# Patient Record
Sex: Male | Born: 1995 | Hispanic: Yes | Marital: Single | Smoking: Current every day smoker
Health system: Southern US, Community
[De-identification: ages and names within clinical notes are randomized; demographics above are authoritative.]

---
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
## 2018-09-20 ENCOUNTER — Other Ambulatory Visit: Payer: Self-pay

## 2018-09-20 ENCOUNTER — Encounter: Payer: Self-pay | Admitting: Emergency Medicine

## 2018-09-20 ENCOUNTER — Emergency Department
Admission: EM | Admit: 2018-09-20 | Discharge: 2018-09-20 | Disposition: A | Discharge status: Home / Self Care | Attending: Emergency Medicine | Admitting: Emergency Medicine

## 2018-09-20 ENCOUNTER — Emergency Department

## 2018-09-20 DIAGNOSIS — F17210 Nicotine dependence, cigarettes, uncomplicated: Secondary | ICD-10-CM | POA: Insufficient documentation

## 2018-09-20 DIAGNOSIS — B349 Viral infection, unspecified: Secondary | ICD-10-CM | POA: Diagnosis not present

## 2018-09-20 DIAGNOSIS — Z20828 Contact with and (suspected) exposure to other viral communicable diseases: Secondary | ICD-10-CM | POA: Diagnosis not present

## 2018-09-20 DIAGNOSIS — Z1383 Encounter for screening for respiratory disorder NEC: Secondary | ICD-10-CM

## 2018-09-20 DIAGNOSIS — R509 Fever, unspecified: Secondary | ICD-10-CM | POA: Diagnosis present

## 2018-09-20 DIAGNOSIS — Z0289 Encounter for other administrative examinations: Secondary | ICD-10-CM

## 2018-09-20 LAB — INFLUENZA PANEL BY PCR (TYPE A & B)
Influenza A By PCR: NEGATIVE
Influenza B By PCR: NEGATIVE

## 2018-09-20 NOTE — Discharge Instructions (Signed)
Patient presented to the emergency department for evaluation of cough, fever, shortness of breath for the past 10 days.  Patient has had contact with another individual with international travel.  Patient is unsure whether this contact has been diagnosed or assessed for COVID-19.  At this time, with possible high risk contact, being symptomatic patient is tested for coronavirus.  At this time, testing will take approximately 3 to 4 days to return.  Until this returns, patient should remain in solitary/isolation.  Law enforcement/healthcare workers should maintain PPE to include at bare minimum a mask with contact with the patient.  If available, eye protection and gown should also be worn.  Patient may receive Tylenol, 1000 mg every 8 hour as needed for fever.  If this does not control temperature, patient may also receive 800 mg ibuprofen every 6 hours for fever.  If patient receives a negative test in 4 days, no further contact precautions are needed.  With a negative test, patient will no longer need isolation.  If patient is positive, symptom control with contact precautions should persist.  If patient worsens symptomatically, patient should be evaluated in the emergency department.

## 2018-09-20 NOTE — ED Provider Notes (Signed)
[facility] Emergency Department Provider Note  ____________________________________________  Time seen: Approximately 7:22 PM  I have reviewed the triage vital signs and the nursing notes.   HISTORY  Chief Complaint Fever and Cough    HPI Ronald Barron is a 23 y.o. male who presents the emergency department in custody of law enforcement for viral testing.  Patient has had fever, cough, shortness of breath x10 days.  Patient states that he was in contact with somebody who had traveled internationally.  That contacted similar symptoms but it is unknown whether the other individual was tested for coronavirus.  Patient has had a fever up to 103 F, chills, congestion, sore throat, cough, shortness of breath and a headache.  Patient denies any frank difficulty breathing but states that normal activity causes shortness of breath.  No medical history.  No chronic lung problems.  No history of asthma.  Patient does use Tylenol but no other medications.         History reviewed. No pertinent past medical history.  There are no active problems to display for this patient.   History reviewed. No pertinent surgical history.  Prior to Admission medications   Not on File    Allergies Patient has no known allergies.  No family history on file.  Social History Social History   Tobacco Use  . Smoking status: Current Every Day Smoker  . Smokeless tobacco: Never Used  Substance Use Topics  . Alcohol use: Yes  . Drug use: Yes    Types: Cocaine, Marijuana     Review of Systems  Constitutional: Positive fever/chills Eyes: No visual changes. No discharge ENT: Positive for nasal congestion and sore throat. Cardiovascular: no chest pain. Respiratory: Positive for cough and shortness of breath Gastrointestinal: No abdominal pain.  No nausea, no vomiting.  No diarrhea.  No constipation. Musculoskeletal: Negative for musculoskeletal pain. Skin: Negative  for rash, abrasions, lacerations, ecchymosis. Neurological: Positive for headache. 10-point ROS otherwise negative.  ____________________________________________   PHYSICAL EXAM:  VITAL SIGNS: ED Triage Vitals [09/20/18 1826]  Enc Vitals Group     BP 121/78     Pulse Rate 100     Resp 16     Temp 97.7 F (36.5 C)     Temp Source Oral     SpO2 99 %     Weight      Height      Head Circumference      Peak Flow      Pain Score 0     Pain Loc      Pain Edu?      Excl. in GC?      Constitutional: Alert and oriented. Well appearing and in no acute distress. Eyes: Conjunctivae are normal. PERRL. EOMI. Head: Atraumatic. ENT:      Ears: EACs and TMs unremarkable bilaterally.      Nose: Minimal clear congestion/rhinnorhea.      Mouth/Throat: Mucous membranes are moist.  Oropharynx is mildly erythematous but nonedematous.  Uvula is midline.  Tonsils are mildly erythematous but nonedematous and no exudates. Neck: No stridor.  Neck is supple full range of motion Hematological/Lymphatic/Immunilogical: Scattered, mobile, nontender anterior cervical lymphadenopathy. Cardiovascular: Normal rate, regular rhythm. Normal S1 and S2.  Good peripheral circulation. Respiratory: Normal respiratory effort without tachypnea or retractions. Lungs with a few scattered expiratory wheezes.  No rales or rhonchi.  No crackles.Peri Jefferson air entry to the bases with no decreased or absent breath sounds. Musculoskeletal: Full range of  motion to all extremities. No gross deformities appreciated. Neurologic:  Normal speech and language. No gross focal neurologic deficits are appreciated.  Skin:  Skin is warm, dry and intact. No rash noted. Psychiatric: Mood and affect are normal. Speech and behavior are normal. Patient exhibits appropriate insight and judgement.   ____________________________________________   LABS (all labs ordered are listed, but only abnormal results are displayed)  Labs Reviewed   NOVEL CORONAVIRUS, NAA (HOSPITAL ORDER, SEND-OUT TO REF LAB)  INFLUENZA PANEL BY PCR (TYPE A & B)   ____________________________________________  EKG   ____________________________________________  RADIOLOGY I personally viewed and evaluated these images as part of my medical decision making, as well as reviewing the written report by the radiologist.  Dg Chest Portable 1 View  Result Date: 09/20/2018 CLINICAL DATA:  Cough and fever. EXAM: PORTABLE CHEST 1 VIEW COMPARISON:  Thoracic spine radiographs 06/27/2014 FINDINGS: The cardiomediastinal silhouette is within normal limits. The lungs are well inflated and clear. There is no evidence of pleural effusion or pneumothorax. No acute osseous abnormality is identified. IMPRESSION: No active disease. Electronically Signed   By: Sebastian Ache M.D.   On: 09/20/2018 19:54    ____________________________________________    PROCEDURES  Procedure(s) performed:    Procedures    Medications - No data to display   ____________________________________________   INITIAL IMPRESSION / ASSESSMENT AND PLAN / ED COURSE  Pertinent labs & imaging results that were available during my care of the patient were reviewed by me and considered in my medical decision making (see chart for details).  Review of the [place] CSRS was performed in accordance of the [facility] prior to dispensing any controlled drugs.           Patient's diagnosis is consistent with viral illness, encounter for screening for respiratory disorder/COVID-19, and encounter for health examination are presented.  Patient presented to the emergency department in the custody of law enforcement for evaluation of respiratory complaints.  Patient has had fever, cough, shortness of breath for 10 days.  Patient does have a high risk contact with international travel.  This contact has similar symptoms but according to the patient he is unsure whether he has sought medical care.  Given  contact, the fact that patient would be incarcerated in tight quarters with other prisoners, patient was evaluated for influenza, bronchitis, pneumonia as well as COVID-19.  Chest x-ray returns with no acute consolidation concerning for pneumonia.  No significant findings of groundglass opacity or peribronchial thickening.  Influenza testing is not back at this time but with 10 days of symptoms without resolution I do not suspect influenza.  I have informed on enforcement that patient will need to be in isolation for minimum of the next 4 days.  If patient's testing returns negative at that time patient does not need to be quarantined.  If results are positive, patient will be managed symptomatically.  If symptoms worsen patient will return to the emergency department for further evaluation...  Patient will be discharged into the custody of [facility] [employer].  Patient is given ED precautions to return to the ED for any worsening or new symptoms.     ____________________________________________  FINAL CLINICAL IMPRESSION(S) / ED DIAGNOSES  Final diagnoses:  Viral illness  Encounter for screening for respiratory disorder  Encounter for health examination of prisoner      NEW MEDICATIONS STARTED DURING THIS VISIT:  ED Discharge Orders    None          This chart  was dictated using voice recognition software/Dragon. Despite best efforts to proofread, errors can occur which can change the meaning. Any change was purely unintentional.    Lanette Hampshire 09/20/18 2013    Minna Antis, MD 09/20/18 [phone]

## 2018-09-20 NOTE — ED Triage Notes (Addendum)
PT with [employer] for rule out virus. Pt states cough and fever since last week. Pt states has been around friend recently that has had traveled from [place] recently. Pt states increased fatigue with exertion. RR even and unlabored, NAD noted , no fever or cough noted in triage

## 2018-09-28 ENCOUNTER — Telehealth: Payer: Self-pay | Admitting: Emergency Medicine

## 2018-09-28 LAB — NOVEL CORONAVIRUS, NAA (HOSP ORDER, SEND-OUT TO REF LAB; TAT 18-24 HRS): SARS-CoV-2, NAA: NOT DETECTED

## 2018-09-28 NOTE — Telephone Encounter (Addendum)
Called patient to inform of covid test result--not detected. There was no answer and no voicemail.  Called [place] [facility] where patient is incarcerated and informed Pam in medical of result.

## 2019-09-29 ENCOUNTER — Ambulatory Visit

## 2019-10-30 IMAGING — DX PORTABLE CHEST - 1 VIEW
1 series · 1 of 1 positions shown · non-contrast
Comparison: Thoracic spine radiographs 06/27/2014

CLINICAL DATA: Cough and fever.

EXAM:
PORTABLE CHEST 1 VIEW

[chest ap]
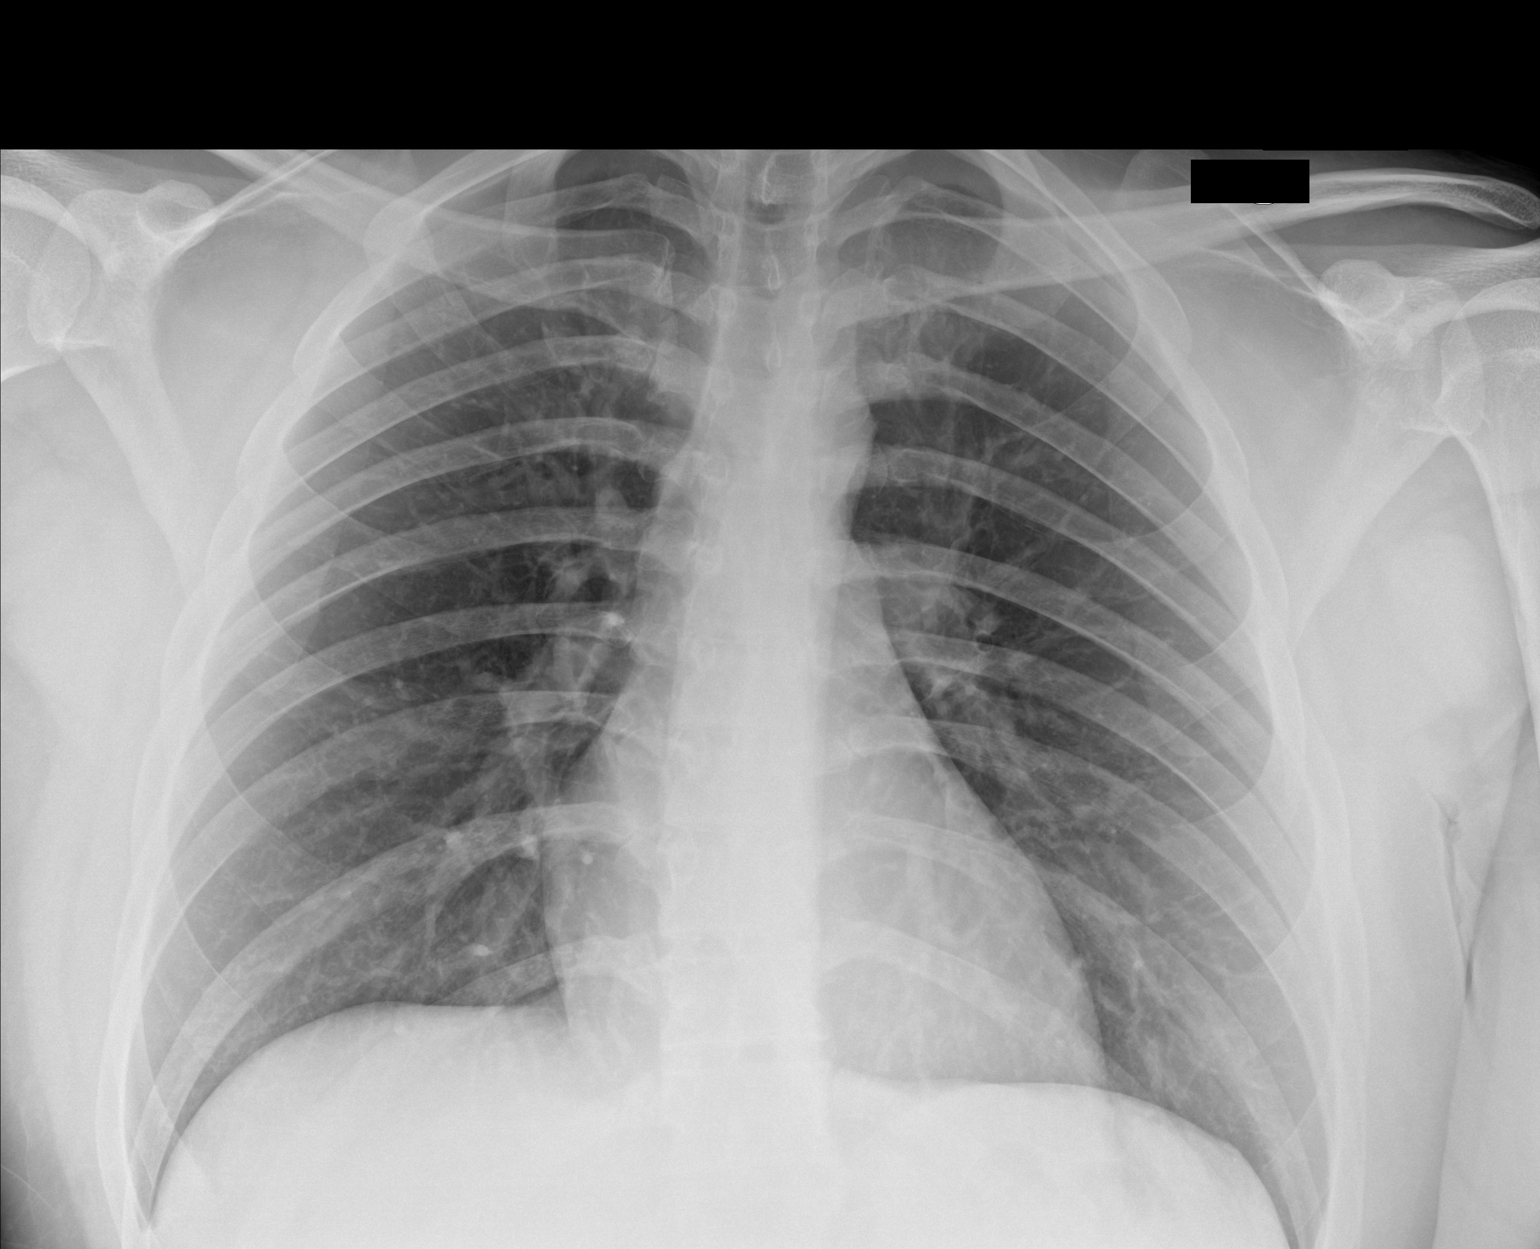

[1 of 1 positions shown; findings below may reference images not displayed]

FINDINGS: The cardiomediastinal silhouette is within normal limits. The lungs
are well inflated and clear. There is no evidence of pleural
effusion or pneumothorax. No acute osseous abnormality is
identified.
IMPRESSION: No active disease.
